# Patient Record
Sex: Male | Born: 1995 | Hispanic: Yes | Marital: Married | Smoking: Never smoker
Health system: Southern US, Community
[De-identification: ages and names within clinical notes are randomized; demographics above are authoritative.]

## PROBLEM LIST (undated history)

## (undated) HISTORY — PX: CHOLECYSTECTOMY: SHX55

---
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
## 2020-02-15 ENCOUNTER — Ambulatory Visit (HOSPITAL_COMMUNITY)
Admission: EM | Admit: 2020-02-15 | Discharge: 2020-02-15 | Disposition: A | Discharge status: Home / Self Care | Payer: Self-pay | Attending: Family Medicine | Admitting: Family Medicine

## 2020-02-15 ENCOUNTER — Other Ambulatory Visit: Payer: Self-pay

## 2020-02-15 ENCOUNTER — Encounter (HOSPITAL_COMMUNITY): Payer: Self-pay

## 2020-02-15 DIAGNOSIS — R1030 Lower abdominal pain, unspecified: Secondary | ICD-10-CM

## 2020-02-15 MED ORDER — POLYETHYLENE GLYCOL 3350 17 G PO PACK: 17.0000 g | PACK | Freq: Every day | ORAL | 0 refills | Status: AC

## 2020-02-15 NOTE — ED Provider Notes (Signed)
[facility]    CSN: [id number] Arrival date & time: 02/15/20  0806      History   Chief Complaint Chief Complaint  Patient presents with  . Abdominal Pain    HPI Bernard Foster is a 24 y.o. male.   HPI  Patient is here for mild crampy abdominal pain.  He is concerned because in a bowel movement this morning that had some blood streaks in the toilet.  He has never had this before.  He does have occasional constipation.  Irregular.  He states that he does have a history of eating fast food for the last couple of weeks, more than his usual.  No fever or chills.  No nausea or vomiting.  Else at home sick.  No recent travel  History reviewed. No pertinent past medical history.  There are no problems to display for this patient.   History reviewed. No pertinent surgical history.     Home Medications    Prior to Admission medications   Medication Sig Start Date End Date Taking? Authorizing Provider  polyethylene glycol (MIRALAX) 17 g packet Take 17 g by mouth daily. 02/15/20   Eustace Moore, MD    Family History Family History  Problem Relation Age of Onset  . Heart attack Maternal Grandfather   . Cancer Paternal Grandmother   . Diabetes Paternal Grandfather     Social History Social History   Tobacco Use  . Smoking status: Never Smoker  . Smokeless tobacco: Never Used  Substance Use Topics  . Alcohol use: No  . Drug use: No     Allergies   Patient has no known allergies.   Review of Systems Review of Systems  Gastrointestinal: Positive for abdominal pain, blood in stool and constipation.     Physical Exam Triage Vital Signs ED Triage Vitals [02/15/20 0839]  Enc Vitals Group     BP 124/73     Pulse Rate (!) 56     Resp 16     Temp 98.4 F (36.9 C)     Temp Source Oral     SpO2 98 %     Weight      Height      Head Circumference      Peak Flow      Pain Score 3     Pain Loc      Pain Edu?      Excl. in GC?    No data  found.  Updated Vital Signs BP 124/73 (BP Location: Right Arm)   Pulse (!) 56   Temp 98.4 F (36.9 C) (Oral)   Resp 16   SpO2 98%    Physical Exam Constitutional:      General: He is not in acute distress.    Appearance: He is well-developed.  HENT:     Head: Normocephalic and atraumatic.     Mouth/Throat:     Comments: Mask is in place Eyes:     Conjunctiva/sclera: Conjunctivae normal.     Pupils: Pupils are equal, round, and reactive to light.  Cardiovascular:     Rate and Rhythm: Normal rate.  Pulmonary:     Effort: Pulmonary effort is normal. No respiratory distress.  Abdominal:     General: Abdomen is flat. Bowel sounds are normal. There is no distension.     Palpations: Abdomen is soft. There is no hepatomegaly or splenomegaly.     Tenderness: There is no abdominal tenderness. Negative signs include Murphy's sign and  McBurney's sign.     Hernia: No hernia is present.     Comments: Well-healed right lower quadrant scar  Musculoskeletal:        General: Normal range of motion.     Cervical back: Normal range of motion.  Skin:    General: Skin is warm and dry.  Neurological:     Mental Status: He is alert.  Psychiatric:        Mood and Affect: Mood normal.        Behavior: Behavior normal.      UC Treatments / Results  Labs (all labs ordered are listed, but only abnormal results are displayed) Labs Reviewed - No data to display  EKG   Radiology No results found.  Procedures Procedures (including critical care time)  Medications Ordered in UC Medications - No data to display  Initial Impression / Assessment and Plan / UC Course  I have reviewed the triage vital signs and the nursing notes.  Pertinent labs & imaging results that were available during my care of the patient were reviewed by me and considered in my medical decision making (see chart for details).     Benign exam.  History of irregularity.  Trace of blood in stool.  Counseled Final  Clinical Impressions(s) / UC Diagnoses   Final diagnoses:  Lower abdominal pain     Discharge Instructions     Take the miralax daily Drink more water Avoid fatty or highly spiced foods for a couple of days Return for problems   ED Prescriptions    Medication Sig Dispense Auth. Provider   polyethylene glycol (MIRALAX) 17 g packet Take 17 g by mouth daily. 14 each Raylene Everts, MD     PDMP not reviewed this encounter.   Raylene Everts, MD 02/15/20 [phone]

## 2020-02-15 NOTE — ED Triage Notes (Signed)
Pt reports he woke up this morning with abdominal pain and he saw some blood in his stools thus morning.

## 2020-02-15 NOTE — Discharge Instructions (Signed)
Take the miralax daily Drink more water Avoid fatty or highly spiced foods for a couple of days Return for problems

## 2021-06-03 ENCOUNTER — Emergency Department (HOSPITAL_COMMUNITY)
Admission: EM | Admit: 2021-06-03 | Discharge: 2021-06-03 | Disposition: A | Discharge status: Home / Self Care | Payer: Self-pay | Attending: Emergency Medicine | Admitting: Emergency Medicine

## 2021-06-03 ENCOUNTER — Emergency Department (HOSPITAL_COMMUNITY): Payer: Self-pay

## 2021-06-03 ENCOUNTER — Encounter (HOSPITAL_COMMUNITY): Payer: Self-pay | Admitting: Emergency Medicine

## 2021-06-03 ENCOUNTER — Other Ambulatory Visit: Payer: Self-pay

## 2021-06-03 DIAGNOSIS — Z20822 Contact with and (suspected) exposure to covid-19: Secondary | ICD-10-CM | POA: Insufficient documentation

## 2021-06-03 DIAGNOSIS — R1013 Epigastric pain: Secondary | ICD-10-CM | POA: Insufficient documentation

## 2021-06-03 DIAGNOSIS — R112 Nausea with vomiting, unspecified: Secondary | ICD-10-CM | POA: Insufficient documentation

## 2021-06-03 LAB — CBC
HCT: 41.8 % (ref 39.0–52.0)
Hemoglobin: 14 g/dL (ref 13.0–17.0)
MCH: 28.6 pg (ref 26.0–34.0)
MCHC: 33.5 g/dL (ref 30.0–36.0)
MCV: 85.5 fL (ref 80.0–100.0)
Platelets: 334 10*3/uL (ref 150–400)
RBC: 4.89 MIL/uL (ref 4.22–5.81)
RDW: 13.1 % (ref 11.5–15.5)
WBC: 16.4 10*3/uL — ABNORMAL HIGH (ref 4.0–10.5)
nRBC: 0 % (ref 0.0–0.2)

## 2021-06-03 LAB — URINALYSIS, ROUTINE W REFLEX MICROSCOPIC
Bilirubin Urine: NEGATIVE
Glucose, UA: NEGATIVE mg/dL
Hgb urine dipstick: NEGATIVE
Ketones, ur: >80 mg/dL — AB
Leukocytes,Ua: NEGATIVE
Nitrite: NEGATIVE
Protein, ur: NEGATIVE mg/dL
Specific Gravity, Urine: 1.015 (ref 1.005–1.030)
pH: 8.5 — ABNORMAL HIGH (ref 5.0–8.0)

## 2021-06-03 LAB — COMPREHENSIVE METABOLIC PANEL
ALT: 15 U/L (ref 0–44)
AST: 22 U/L (ref 15–41)
Albumin: 4.5 g/dL (ref 3.5–5.0)
Alkaline Phosphatase: 94 U/L (ref 38–126)
Anion gap: 14 (ref 5–15)
BUN: 6 mg/dL (ref 6–20)
CO2: 20 mmol/L — ABNORMAL LOW (ref 22–32)
Calcium: 9.7 mg/dL (ref 8.9–10.3)
Chloride: 106 mmol/L (ref 98–111)
Creatinine, Ser: 0.92 mg/dL (ref 0.61–1.24)
GFR, Estimated: >60 mL/min (ref >60)
Glucose, Bld: 131 mg/dL — ABNORMAL HIGH (ref 70–99)
Potassium: 3.2 mmol/L — ABNORMAL LOW (ref 3.5–5.1)
Sodium: 140 mmol/L (ref 135–145)
Total Bilirubin: 1.2 mg/dL (ref 0.3–1.2)
Total Protein: 7.8 g/dL (ref 6.5–8.1)

## 2021-06-03 LAB — LIPASE, BLOOD: Lipase: 22 U/L (ref 11–51)

## 2021-06-03 LAB — TYPE AND SCREEN
ABO/RH(D): O NEG
Antibody Screen: NEGATIVE

## 2021-06-03 LAB — RESP PANEL BY RT-PCR (FLU A&B, COVID) ARPGX2
Influenza A by PCR: NEGATIVE
Influenza B by PCR: NEGATIVE
SARS Coronavirus 2 by RT PCR: NEGATIVE

## 2021-06-03 LAB — ABO/RH: ABO/RH(D): O NEG

## 2021-06-03 LAB — PROTIME-INR
INR: 1.1 (ref 0.8–1.2)
Prothrombin Time: 14 seconds (ref 11.4–15.2)

## 2021-06-03 MED ORDER — ONDANSETRON 4 MG PO TBDP: 4.0000 mg | ORAL_TABLET | Freq: Three times a day (TID) | ORAL | 0 refills | Status: AC | PRN

## 2021-06-03 MED ORDER — MORPHINE SULFATE (PF) 4 MG/ML IV SOLN
4.0000 mg | Freq: Once | INTRAVENOUS | Status: AC
  Administered 2021-06-03: 4 mg via INTRAVENOUS
  Filled 2021-06-03: qty 1

## 2021-06-03 MED ORDER — PANTOPRAZOLE SODIUM 40 MG PO TBEC: 40.0000 mg | DELAYED_RELEASE_TABLET | Freq: Every day | ORAL | 0 refills | Status: AC

## 2021-06-03 MED ORDER — IOHEXOL 350 MG/ML SOLN
100.0000 mL | Freq: Once | INTRAVENOUS | Status: AC | PRN
  Administered 2021-06-03: 100 mL via INTRAVENOUS

## 2021-06-03 MED ORDER — DICYCLOMINE HCL 10 MG PO CAPS
10.0000 mg | ORAL_CAPSULE | Freq: Once | ORAL | Status: AC
  Administered 2021-06-03: 10 mg via ORAL
  Filled 2021-06-03: qty 1

## 2021-06-03 MED ORDER — LACTATED RINGERS IV BOLUS
1000.0000 mL | Freq: Once | INTRAVENOUS | Status: AC
  Administered 2021-06-03: 1000 mL via INTRAVENOUS

## 2021-06-03 MED ORDER — ONDANSETRON HCL 4 MG/2ML IJ SOLN
4.0000 mg | Freq: Once | INTRAMUSCULAR | Status: AC
  Administered 2021-06-03: 4 mg via INTRAVENOUS
  Filled 2021-06-03: qty 2

## 2021-06-03 MED ORDER — ONDANSETRON 4 MG PO TBDP
4.0000 mg | ORAL_TABLET | Freq: Once | ORAL | Status: AC
  Administered 2021-06-03: 4 mg via ORAL
  Filled 2021-06-03: qty 1

## 2021-06-03 MED ORDER — DICYCLOMINE HCL 20 MG PO TABS: 20.0000 mg | ORAL_TABLET | Freq: Three times a day (TID) | ORAL | 0 refills | Status: AC | PRN

## 2021-06-03 NOTE — ED Notes (Signed)
Per EMT, pt vomited blood in bathroom.

## 2021-06-03 NOTE — ED Provider Notes (Signed)
Emergency Department Provider Note   I have reviewed the triage vital signs and the nursing notes.   HISTORY  Chief Complaint No chief complaint on file.  Offered Spanish interpreter but patient reports he prefers [place].   HPI Bernard Foster is a 25 y.o. male with past medical history reviewed below including prior appendectomy 1 year prior presents to the emergency department with epigastric abdominal pain along with nausea and vomiting.  Symptoms began yesterday but have worsened in the past 24 hours.  He is having more severe pain and arrived to the emergency department for evaluation.  While in the emergency department waiting room, the patient developed additional nausea and vomiting and had some reportedly bright red blood with vomiting.  He is not having chest pain or trouble breathing.  No fevers or chills.  He is not passing blood per rectum according to the patient.  No radiation of symptoms or other modifying factors. He drinks EtOH occasionally but not for the last week. Denies illicit drug use, specifically marijuana.    History reviewed. No pertinent past medical history.  There are no problems to display for this patient.   Past Surgical History:  Procedure Laterality Date   CHOLECYSTECTOMY      Allergies Patient has no known allergies.  Family History  Problem Relation Age of Onset   Heart attack Maternal Grandfather    Cancer Paternal Grandmother    Diabetes Paternal Grandfather     Social History Social History   Tobacco Use   Smoking status: Never   Smokeless tobacco: Never  Substance Use Topics   Alcohol use: No   Drug use: No    Review of Systems  Constitutional: No fever/chills Eyes: No visual changes. ENT: No sore throat. Cardiovascular: Denies chest pain. Respiratory: Denies shortness of breath. Gastrointestinal: Positive epigastric abdominal pain. Positive nausea and vomiting.  No diarrhea.  No constipation. Genitourinary: Negative  for dysuria. Musculoskeletal: Negative for back pain. Skin: Negative for rash. Neurological: Negative for headaches, focal weakness or numbness.  10-point ROS otherwise negative.  ____________________________________________   PHYSICAL EXAM:  VITAL SIGNS: ED Triage Vitals  Enc Vitals Group     BP 06/03/21 1217 (!) 184/101     Pulse Rate 06/03/21 1217 63     Resp 06/03/21 1217 16     Temp 06/03/21 1217 98.2 F (36.8 C)     Temp Source 06/03/21 1217 Oral     SpO2 06/03/21 1217 99 %   Constitutional: Alert and oriented. Well appearing and in no acute distress. Eyes: Conjunctivae are normal. Head: Atraumatic. Nose: No congestion/rhinnorhea. Mouth/Throat: Mucous membranes are moist.  Neck: No stridor.   Cardiovascular: Normal rate, regular rhythm. Good peripheral circulation. Grossly normal heart sounds.   Respiratory: Normal respiratory effort.  No retractions. Lungs CTAB. Gastrointestinal: Soft with mild epigastric and LUQ tenderness. No rebound or guarding. No distention.  Musculoskeletal: No gross deformities of extremities. Neurologic:  Normal speech and language.  Skin:  Skin is warm, dry and intact. No rash noted.   ____________________________________________   LABS (all labs ordered are listed, but only abnormal results are displayed)  Labs Reviewed  COMPREHENSIVE METABOLIC PANEL - Abnormal; Notable for the following components:      Result Value   Potassium 3.2 (*)    CO2 20 (*)    Glucose, Bld 131 (*)    All other components within normal limits  CBC - Abnormal; Notable for the following components:   WBC 16.4 (*)  All other components within normal limits  URINALYSIS, ROUTINE W REFLEX MICROSCOPIC - Abnormal; Notable for the following components:   pH 8.5 (*)    Ketones, ur >80 (*)    All other components within normal limits  RESP PANEL BY RT-PCR (FLU A&B, COVID) ARPGX2  LIPASE, BLOOD  PROTIME-INR  TYPE AND SCREEN  ABO/RH    ____________________________________________  EKG   EKG Interpretation  Date/Time:  Sunday June 03 2021 14:34:15 EDT Ventricular Rate:  57 PR Interval:  166 QRS Duration: 109 QT Interval:  423 QTC Calculation: 412 R Axis:   55 Text Interpretation: Sinus rhythm Confirmed by Alona Bene [phone]) on 06/03/2021 2:39:40 PM        ____________________________________________  RADIOLOGY  DG Chest 2 View  Result Date: 06/03/2021 CLINICAL DATA:  Shortness of breath, nausea and vomiting. EXAM: CHEST - 2 VIEW COMPARISON:  None. FINDINGS: The heart size and mediastinal contours are within normal limits. Both lungs are clear. The visualized skeletal structures are unremarkable. IMPRESSION: No active cardiopulmonary disease. Electronically Signed   By: Sherian Rein M.D.   On: 06/03/2021 13:31   CT ABDOMEN PELVIS W CONTRAST  Result Date: 06/03/2021 CLINICAL DATA:  Right lower quadrant pain since this morning, nausea and vomiting EXAM: CT ABDOMEN AND PELVIS WITH CONTRAST TECHNIQUE: Multidetector CT imaging of the abdomen and pelvis was performed using the standard protocol following bolus administration of intravenous contrast. CONTRAST:  OMNIPAQUE IOHEXOL 350 MG/ML SOLN COMPARISON:  None. FINDINGS: Lower chest: No acute pleural or parenchymal lung disease. Hepatobiliary: No focal liver abnormality is seen. No gallstones, gallbladder wall thickening, or biliary dilatation. Pancreas: Unremarkable. No pancreatic ductal dilatation or surrounding inflammatory changes. Spleen: Normal in size without focal abnormality. Adrenals/Urinary Tract: No urinary tract calculi or obstructive uropathy. The kidneys enhance normally. The adrenals and bladder are unremarkable. Stomach/Bowel: No bowel obstruction or ileus. The appendix, if still present, is not identified. Vertical scar within the right lower quadrant abdominal wall suggest possible prior appendectomy. No bowel wall thickening.  Vascular/Lymphatic: No significant vascular findings are present. No enlarged abdominal or pelvic lymph nodes. Reproductive: Prostate is unremarkable. Other: No free fluid or free gas.  No abdominal wall hernia. Musculoskeletal: No acute or destructive bony lesions. Reconstructed images demonstrate no additional findings. IMPRESSION: 1. No acute intra-abdominal or intrapelvic process. 2. Findings suggesting likely prior appendectomy. Please correlate with surgical history. Electronically Signed   By: Sharlet Salina M.D.   On: 06/03/2021 15:35    ____________________________________________   PROCEDURES  Procedure(s) performed:   Procedures  None  ____________________________________________   INITIAL IMPRESSION / ASSESSMENT AND PLAN / ED COURSE  Pertinent labs & imaging results that were available during my care of the patient were reviewed by me and considered in my medical decision making (see chart for details).   Patient presents emergency department with epigastric abdominal pain and vomiting.  Some streaky, bright red blood with vomiting in the waiting room per triage nursing report.  No peritoneal findings on exam.   Differential diagnosis includes but is not exclusive to acute cholecystitis, intrathoracic causes for epigastric abdominal pain, gastritis, duodenitis, pancreatitis, small bowel or large bowel obstruction, abdominal aortic aneurysm, hernia, gastritis, etc.  04:30 PM  CT imaging showing prior appendectomy. Gallbladder is radiographically normal.  Patient continues to have some abdominal soreness and nausea on reassessment.  His lab work here is reassuring other than leukocytosis he has normal LFTs, bilirubin, lipase.  No signs or specific symptoms to suggest infection or developing  sepsis although this was considered. Vitals not consistent with SIRS. Plan for Bentyl here along with meds at home for supportive care. Will give contact information for local GI and PCP for  follow up. Discussed strict ED return precautions.   ____________________________________________  FINAL CLINICAL IMPRESSION(S) / ED DIAGNOSES  Final diagnoses:  Epigastric pain  Non-intractable vomiting with nausea, unspecified vomiting type     MEDICATIONS GIVEN DURING THIS VISIT:  Medications  ondansetron (ZOFRAN-ODT) disintegrating tablet 4 mg (4 mg Oral Given 06/03/21 1239)  morphine 4 MG/ML injection 4 mg (4 mg Intravenous Given 06/03/21 1510)  ondansetron (ZOFRAN) injection 4 mg (4 mg Intravenous Given 06/03/21 1508)  lactated ringers bolus 1,000 mL (0 mLs Intravenous Stopped 06/03/21 1643)  iohexol (OMNIPAQUE) 350 MG/ML injection 100 mL (100 mLs Intravenous Contrast Given 06/03/21 1508)  dicyclomine (BENTYL) capsule 10 mg (10 mg Oral Given 06/03/21 1643)     NEW OUTPATIENT MEDICATIONS STARTED DURING THIS VISIT:  New Prescriptions   DICYCLOMINE (BENTYL) 20 MG TABLET    Take 1 tablet (20 mg total) by mouth 3 (three) times daily as needed for spasms (abdominal cramping).   ONDANSETRON (ZOFRAN ODT) 4 MG DISINTEGRATING TABLET    Take 1 tablet (4 mg total) by mouth every 8 (eight) hours as needed for nausea or vomiting.   PANTOPRAZOLE (PROTONIX) 40 MG TABLET    Take 1 tablet (40 mg total) by mouth daily.    Note:  This document was prepared using Dragon voice recognition software and may include unintentional dictation errors.  Alona Bene, MD, [facility]    Anely Spiewak, Arlyss Repress, MD 06/03/21 [phone]

## 2021-06-03 NOTE — ED Notes (Signed)
Patient transported to CT 

## 2021-06-03 NOTE — ED Triage Notes (Addendum)
Pt reports severe RLQ pain since this morning with nausea and vomiting.  Pt hyperventilating and sticking his finger down his throat to induce vomiting.  Zofran given at triage.  Denies diarrhea.  Gallbladder removed 1 year ago.

## 2021-06-03 NOTE — ED Provider Notes (Signed)
Emergency Medicine Provider Triage Evaluation Note  Bernard Foster , a 25 y.o. male  was evaluated in triage.  Pt complains of periumbilical/right lower quadrant abdominal pain that started suddenly this morning with severe nausea 5 episodes of NBNB emesis.  No history of diarrhea patient is diaphoretic, endorses fevers and chills.  History of cholecystectomy 1 year ago.  Review of Systems  Positive: Periumbilical and right lower abdominal pain, nausea, vomiting, chills, diaphoresis, shortness of breath Negative: Chest pain  Physical Exam  BP (!) 184/101 (BP Location: Left Arm)   Pulse 63   Temp 98.2 F (36.8 C) (Oral)   Resp 16   SpO2 99%  Gen:   Awake, no distress   Resp:  Normal effort  MSK:   Moves extremities without difficulty  Other:  Rales in lungs bilaterally, periumbilical and right lower abdominal tenderness palpation without rebound or guarding.  Patient vomiting throughout exam.  Medical Decision Making  Medically screening exam initiated at 12:45 PM.  Appropriate orders placed.  Bernard Foster was informed that the remainder of the evaluation will be completed by another provider, this initial triage assessment does not replace that evaluation, and the importance of remaining in the ED until their evaluation is complete.  Zofran administered in triage.  This chart was dictated using voice recognition software, Dragon. Despite the best efforts of this provider to proofread and correct errors, errors may still occur which can change documentation meaning.    Sherrilee Gilles 06/03/21 1246    Maia Plan, MD 06/10/21 1727

## 2021-06-03 NOTE — ED Notes (Signed)
Pt ambulated to the bathroom on his own power, gait even and steady 

## 2021-06-03 NOTE — Discharge Instructions (Signed)

## 2021-10-30 ENCOUNTER — Emergency Department (HOSPITAL_COMMUNITY): Payer: Self-pay

## 2021-10-30 ENCOUNTER — Encounter (HOSPITAL_COMMUNITY): Payer: Self-pay | Admitting: Emergency Medicine

## 2021-10-30 ENCOUNTER — Emergency Department (HOSPITAL_COMMUNITY)
Admission: EM | Admit: 2021-10-30 | Discharge: 2021-10-30 | Disposition: A | Discharge status: Home / Self Care | Payer: Self-pay | Attending: Emergency Medicine | Admitting: Emergency Medicine

## 2021-10-30 DIAGNOSIS — R55 Syncope and collapse: Secondary | ICD-10-CM | POA: Insufficient documentation

## 2021-10-30 DIAGNOSIS — R519 Headache, unspecified: Secondary | ICD-10-CM | POA: Insufficient documentation

## 2021-10-30 LAB — CBC WITH DIFFERENTIAL/PLATELET
Abs Immature Granulocytes: 0.02 10*3/uL (ref 0.00–0.07)
Basophils Absolute: 0.1 10*3/uL (ref 0.0–0.1)
Basophils Relative: 1 %
Eosinophils Absolute: 0.1 10*3/uL (ref 0.0–0.5)
Eosinophils Relative: 1 %
HCT: 40.9 % (ref 39.0–52.0)
Hemoglobin: 13.6 g/dL (ref 13.0–17.0)
Immature Granulocytes: 0 %
Lymphocytes Relative: 37 %
Lymphs Abs: 3.9 10*3/uL (ref 0.7–4.0)
MCH: 27.9 pg (ref 26.0–34.0)
MCHC: 33.3 g/dL (ref 30.0–36.0)
MCV: 84 fL (ref 80.0–100.0)
Monocytes Absolute: 0.6 10*3/uL (ref 0.1–1.0)
Monocytes Relative: 6 %
Neutro Abs: 5.9 10*3/uL (ref 1.7–7.7)
Neutrophils Relative %: 55 %
Platelets: 212 10*3/uL (ref 150–400)
RBC: 4.87 MIL/uL (ref 4.22–5.81)
RDW: 13.2 % (ref 11.5–15.5)
WBC: 10.5 10*3/uL (ref 4.0–10.5)
nRBC: 0 % (ref 0.0–0.2)

## 2021-10-30 LAB — BASIC METABOLIC PANEL
Anion gap: 7 (ref 5–15)
BUN: 7 mg/dL (ref 6–20)
CO2: 28 mmol/L (ref 22–32)
Calcium: 8.3 mg/dL — ABNORMAL LOW (ref 8.9–10.3)
Chloride: 101 mmol/L (ref 98–111)
Creatinine, Ser: 0.74 mg/dL (ref 0.61–1.24)
GFR, Estimated: >60 mL/min (ref >60)
Glucose, Bld: 101 mg/dL — ABNORMAL HIGH (ref 70–99)
Potassium: 3.1 mmol/L — ABNORMAL LOW (ref 3.5–5.1)
Sodium: 136 mmol/L (ref 135–145)

## 2021-10-30 MED ORDER — POTASSIUM CHLORIDE CRYS ER 20 MEQ PO TBCR
40.0000 meq | EXTENDED_RELEASE_TABLET | Freq: Once | ORAL | Status: AC
  Administered 2021-10-30: 40 meq via ORAL
  Filled 2021-10-30: qty 2

## 2021-10-30 MED ORDER — ACETAMINOPHEN 325 MG PO TABS
650.0000 mg | ORAL_TABLET | Freq: Once | ORAL | Status: AC
  Administered 2021-10-30: 650 mg via ORAL
  Filled 2021-10-30: qty 2

## 2021-10-30 MED ORDER — SODIUM CHLORIDE 0.9 % IV BOLUS
1000.0000 mL | Freq: Once | INTRAVENOUS | Status: AC
Start: 2021-10-30 — End: 2021-10-30
  Administered 2021-10-30: 1000 mL via INTRAVENOUS

## 2021-10-30 NOTE — ED Provider Notes (Signed)
[facility] [place] [facility]-EMERGENCY DEPT Provider Note   CSN: [id number] Arrival date & time: 10/30/21  1404     History  Chief Complaint  Patient presents with   Headache    Bernard Foster is a 26 y.o. male with no significant past medical history who presents to the ED after a syncopal episode.  Patient states he "passed out" and fell back and hit the back of his head.  He is not currently on any blood thinners.  No preceding symptoms.  Patient states he has not been eating and drinking normally for the past 2 weeks due to stress related to his girlfriend.  Patient notes prior to the episode he smoked marijuana and drank from 1 Corona; however, did not finish the entire beer.  Denies daily alcohol use.  No history of seizures.  Denies urinary incontinence and tongue trauma.  Patient endorses a headache.  No nausea or vomiting. Patient was not post-ictal upon EMS arrival.        Home Medications Prior to Admission medications   Medication Sig Start Date End Date Taking? Authorizing Provider  dicyclomine (BENTYL) 20 MG tablet Take 1 tablet (20 mg total) by mouth 3 (three) times daily as needed for spasms (abdominal cramping). 06/03/21   Long, Arlyss Repress, MD  ondansetron (ZOFRAN ODT) 4 MG disintegrating tablet Take 1 tablet (4 mg total) by mouth every 8 (eight) hours as needed for nausea or vomiting. 06/03/21   Long, Arlyss Repress, MD  pantoprazole (PROTONIX) 40 MG tablet Take 1 tablet (40 mg total) by mouth daily. 06/03/21   Long, Arlyss Repress, MD  polyethylene glycol (MIRALAX) 17 g packet Take 17 g by mouth daily. 02/15/20   Eustace Moore, MD      Allergies    Patient has no known allergies.    Review of Systems   Review of Systems  Cardiovascular:  Negative for chest pain.  Gastrointestinal:  Negative for nausea and vomiting.  Neurological:  Positive for headaches. Negative for dizziness and weakness.   Physical Exam Updated Vital Signs BP 109/65    Pulse 80    Temp  98.2 F (36.8 C) (Oral)    Resp 19    SpO2 100%  Physical Exam Vitals and nursing note reviewed.  Constitutional:      General: He is not in acute distress.    Appearance: He is not ill-appearing.  HENT:     Head: Normocephalic.  Eyes:     Pupils: Pupils are equal, round, and reactive to light.  Cardiovascular:     Rate and Rhythm: Normal rate and regular rhythm.     Pulses: Normal pulses.     Heart sounds: Normal heart sounds. No murmur heard.   No friction rub. No gallop.  Pulmonary:     Effort: Pulmonary effort is normal.     Breath sounds: Normal breath sounds.  Abdominal:     General: Abdomen is flat. There is no distension.     Palpations: Abdomen is soft.     Tenderness: There is no abdominal tenderness. There is no guarding or rebound.  Musculoskeletal:        General: Normal range of motion.     Cervical back: Neck supple.  Skin:    General: Skin is warm and dry.  Neurological:     General: No focal deficit present.     Mental Status: He is alert.     Comments: Speech is clear, able to follow commands CN  III-XII intact Normal strength in upper and lower extremities bilaterally including dorsiflexion and plantar flexion, strong and equal grip strength Sensation grossly intact throughout Moves extremities without ataxia, coordination intact No pronator drift Ambulates without difficulty  Psychiatric:        Mood and Affect: Mood normal.        Behavior: Behavior normal.    ED Results / Procedures / Treatments   Labs (all labs ordered are listed, but only abnormal results are displayed) Labs Reviewed  BASIC METABOLIC PANEL - Abnormal; Notable for the following components:      Result Value   Potassium 3.1 (*)    Glucose, Bld 101 (*)    Calcium 8.3 (*)    All other components within normal limits  CBC WITH DIFFERENTIAL/PLATELET    EKG None  Radiology CT Head Wo Contrast  Result Date: 10/30/2021 CLINICAL DATA:  Head and neck trauma.  Altered mental  status. EXAM: CT HEAD WITHOUT CONTRAST CT CERVICAL SPINE WITHOUT CONTRAST TECHNIQUE: Multidetector CT imaging of the head and cervical spine was performed following the standard protocol without intravenous contrast. Multiplanar CT image reconstructions of the cervical spine were also generated. RADIATION DOSE REDUCTION: This exam was performed according to the departmental dose-optimization program which includes automated exposure control, adjustment of the mA and/or kV according to patient size and/or use of iterative reconstruction technique. COMPARISON:  None. FINDINGS: CT HEAD FINDINGS Brain: There is no evidence of an acute infarct, intracranial hemorrhage, mass, midline shift, or extra-axial fluid collection. The ventricles and sulci are normal. Vascular: No hyperdense vessel. Skull: No fracture or suspicious osseous lesion. Sinuses/Orbits: Mild left frontal and left ethmoid sinus mucosal thickening. Clear mastoid air cells. Unremarkable included orbits. Other: None. CT CERVICAL SPINE FINDINGS Alignment: Cervical spine straightening.  No listhesis. Skull base and vertebrae: No acute fracture or suspicious osseous lesion. Soft tissues and spinal canal: No prevertebral fluid or swelling. No visible canal hematoma. Disc levels:  Unremarkable. Upper chest: Clear lung apices. Other: Borderline enlarged level II lymph nodes, nonspecific but most likely reactive bilateral tonsillar hypertrophy. IMPRESSION: 1. No evidence of acute intracranial abnormality. 2. No evidence of acute fracture or subluxation in the cervical spine. Electronically Signed   By: Sebastian Ache M.D.   On: 10/30/2021 15:55   CT Cervical Spine Wo Contrast  Result Date: 10/30/2021 CLINICAL DATA:  Head and neck trauma.  Altered mental status. EXAM: CT HEAD WITHOUT CONTRAST CT CERVICAL SPINE WITHOUT CONTRAST TECHNIQUE: Multidetector CT imaging of the head and cervical spine was performed following the standard protocol without intravenous  contrast. Multiplanar CT image reconstructions of the cervical spine were also generated. RADIATION DOSE REDUCTION: This exam was performed according to the departmental dose-optimization program which includes automated exposure control, adjustment of the mA and/or kV according to patient size and/or use of iterative reconstruction technique. COMPARISON:  None. FINDINGS: CT HEAD FINDINGS Brain: There is no evidence of an acute infarct, intracranial hemorrhage, mass, midline shift, or extra-axial fluid collection. The ventricles and sulci are normal. Vascular: No hyperdense vessel. Skull: No fracture or suspicious osseous lesion. Sinuses/Orbits: Mild left frontal and left ethmoid sinus mucosal thickening. Clear mastoid air cells. Unremarkable included orbits. Other: None. CT CERVICAL SPINE FINDINGS Alignment: Cervical spine straightening.  No listhesis. Skull base and vertebrae: No acute fracture or suspicious osseous lesion. Soft tissues and spinal canal: No prevertebral fluid or swelling. No visible canal hematoma. Disc levels:  Unremarkable. Upper chest: Clear lung apices. Other: Borderline enlarged level II lymph nodes,  nonspecific but most likely reactive bilateral tonsillar hypertrophy. IMPRESSION: 1. No evidence of acute intracranial abnormality. 2. No evidence of acute fracture or subluxation in the cervical spine. Electronically Signed   By: Sebastian Ache M.D.   On: 10/30/2021 15:55    Procedures Procedures    Medications Ordered in ED Medications  acetaminophen (TYLENOL) tablet 650 mg (650 mg Oral Given 10/30/21 1557)  sodium chloride 0.9 % bolus 1,000 mL (0 mLs Intravenous Stopped 10/30/21 1809)  potassium chloride SA (KLOR-CON M) CR tablet 40 mEq (40 mEq Oral Given 10/30/21 1713)  sodium chloride 0.9 % bolus 1,000 mL (1,000 mLs Intravenous New Bag/Given 10/30/21 1809)    ED Course/ Medical Decision Making/ A&P Clinical Course as of 10/30/21 1914  Tue Oct 30, 2021  1533 BP(!): 83/51 [CA]  1642  Potassium(!): 3.1 [CA]    Clinical Course User Index [CA] Mannie Stabile, PA-C                           Medical Decision Making Amount and/or Complexity of Data Reviewed Independent Historian: friend    Details: brother at bedside Labs: ordered. Decision-making details documented in ED Course. Radiology: ordered and independent interpretation performed. Decision-making details documented in ED Course. ECG/medicine tests: ordered and independent interpretation performed. Decision-making details documented in ED Course.  Risk OTC drugs. Prescription drug management.   26 year old male presents to the ED after syncopal episode.  Patient notes he has not been eating and drinking normally for the past 2 weeks due to stress related to his girlfriend.  Prior to his syncopal episode, patient smoked marijuana and drink from 1 beer.  Denies daily alcohol use.  No previous seizures.  Denies urinary incontinence and tongue trauma.  Upon arrival, vitals all within normal limits.  Patient no acute distress.  Benign physical exam.  Normal neurological exam without any focal deficits.  CT head and cervical spine due to trauma.  Routine labs to rule electrolyte abnormalities due to decreased p.o. intake and syncope.  EKG to rule out cardiac etiology of syncope however, suspicion is lower given no preceding chest pain or cardiac risk factors.  Tylenol given for headache.  Upon reassessment, BP rechecked and patient found to be hypotensive. IVFs given. Hypotension could account for patient's syncopal episode. No chest pain or shortness of breath. Low suspicion for PE/DVT.  CBC unremarkable.  No leukocytosis and normal hemoglobin.  BMP significant for hypokalemia at 3.1.  Potassium repleted here in the ED.  CT head and cervical spine personally reviewed and interpreted which is negative for any acute abnormalities.  Agree with radiology interpretation.  EKG demonstrates normal sinus rhythm with no signs of  acute ischemia.  Upon reassessment, patient admits to feeling better after IVFs. BP improved. Patient observed for 5 hours without any further syncopal episodes. Low suspicion for cardiac etiology of syncope.  Syncope could be related to hypotension. Hospitalization considered however, felt patient was stable for discharge given his BP improved and no further syncopal episodes after 5 hours in the ED. Strict ED precautions discussed with patient. Patient states understanding and agrees to plan. Patient discharged home in no acute distress and stable vitals        Final Clinical Impression(s) / ED Diagnoses Final diagnoses:  Syncope, unspecified syncope type    Rx / DC Orders ED Discharge Orders     None         Mannie Stabile, [place] 10/30/21 1915  Linwood DibblesKnapp, Jon, MD 10/30/21 2356

## 2021-10-30 NOTE — ED Triage Notes (Signed)
Per EMS-states he smoked some weed and then drank a Corona and friends called because he was going "in and out"-initially patient did not want to be transported but when EMS arrived, he stated he wanted to be checked out

## 2021-10-30 NOTE — Discharge Instructions (Signed)
It was a pleasure taking care of you today. As discussed, your BP was low in the ER. I suspect that caused you to pass out. All of your labs were reassuring. Continue to drink extra fluids. Follow-up with PCP within the next week. Return to the ER for new or worsening symptoms.

## 2022-09-14 ENCOUNTER — Emergency Department (HOSPITAL_COMMUNITY): Payer: Self-pay

## 2022-09-14 ENCOUNTER — Emergency Department (HOSPITAL_COMMUNITY)
Admission: EM | Admit: 2022-09-14 | Discharge: 2022-09-14 | Disposition: A | Discharge status: Home / Self Care | Payer: Self-pay | Attending: Emergency Medicine | Admitting: Emergency Medicine

## 2022-09-14 ENCOUNTER — Other Ambulatory Visit: Payer: Self-pay

## 2022-09-14 DIAGNOSIS — E876 Hypokalemia: Secondary | ICD-10-CM | POA: Insufficient documentation

## 2022-09-14 DIAGNOSIS — R55 Syncope and collapse: Secondary | ICD-10-CM

## 2022-09-14 LAB — BASIC METABOLIC PANEL
Anion gap: 7 (ref 5–15)
BUN: 9 mg/dL (ref 6–20)
CO2: 26 mmol/L (ref 22–32)
Calcium: 8.8 mg/dL — ABNORMAL LOW (ref 8.9–10.3)
Chloride: 105 mmol/L (ref 98–111)
Creatinine, Ser: 0.83 mg/dL (ref 0.61–1.24)
GFR, Estimated: >60 mL/min (ref >60)
Glucose, Bld: 112 mg/dL — ABNORMAL HIGH (ref 70–99)
Potassium: 3.4 mmol/L — ABNORMAL LOW (ref 3.5–5.1)
Sodium: 138 mmol/L (ref 135–145)

## 2022-09-14 LAB — CBC WITH DIFFERENTIAL/PLATELET
Abs Immature Granulocytes: 0.03 10*3/uL (ref 0.00–0.07)
Basophils Absolute: 0.1 10*3/uL (ref 0.0–0.1)
Basophils Relative: 1 %
Eosinophils Absolute: 0.2 10*3/uL (ref 0.0–0.5)
Eosinophils Relative: 2 %
HCT: 43.6 % (ref 39.0–52.0)
Hemoglobin: 14.2 g/dL (ref 13.0–17.0)
Immature Granulocytes: 0 %
Lymphocytes Relative: 25 %
Lymphs Abs: 2.5 10*3/uL (ref 0.7–4.0)
MCH: 28.6 pg (ref 26.0–34.0)
MCHC: 32.6 g/dL (ref 30.0–36.0)
MCV: 87.9 fL (ref 80.0–100.0)
Monocytes Absolute: 0.6 10*3/uL (ref 0.1–1.0)
Monocytes Relative: 6 %
Neutro Abs: 6.7 10*3/uL (ref 1.7–7.7)
Neutrophils Relative %: 66 %
Platelets: 332 10*3/uL (ref 150–400)
RBC: 4.96 MIL/uL (ref 4.22–5.81)
RDW: 13.1 % (ref 11.5–15.5)
WBC: 10.1 10*3/uL (ref 4.0–10.5)
nRBC: 0 % (ref 0.0–0.2)

## 2022-09-14 MED ORDER — SODIUM CHLORIDE 0.9 % IV BOLUS
1000.0000 mL | Freq: Once | INTRAVENOUS | Status: AC
  Administered 2022-09-14: 1000 mL via INTRAVENOUS

## 2022-09-14 MED ORDER — KETOROLAC TROMETHAMINE 15 MG/ML IJ SOLN
15.0000 mg | Freq: Once | INTRAMUSCULAR | Status: AC
  Administered 2022-09-14: 15 mg via INTRAVENOUS
  Filled 2022-09-14: qty 1

## 2022-09-14 MED ORDER — POTASSIUM CHLORIDE CRYS ER 20 MEQ PO TBCR
40.0000 meq | EXTENDED_RELEASE_TABLET | Freq: Once | ORAL | Status: DC
  Filled 2022-09-14: qty 2

## 2022-09-14 NOTE — ED Provider Notes (Signed)
[facility] [place] [facility]-EM[facility]ENCY DEPT Provider Note   CSN: [id number] Arrival date & time: 09/14/22  1135     History  Chief Complaint  Patient presents with   Loss of Consciousness    Bernard Foster is a 26 y.o. male.  With no significant past medical history who presents to the ED via EMS for evaluation of syncopal episode.  States he was in the convenience store when he passed out and hit the back of his head.  Had a short episode of dizziness, prior to syncopal episode. Denies lightheadedness or headache.  Similar episode happened few months ago per patient.  Chart review reveals that a fairly similar episode happened on 10/30/2021.  He was found to be slightly hypotensive at that time.  Workup was otherwise unremarkable.  He has not had any similar episodes since that time until today.  He denies other symptoms at this time. States he is feeling better on my evaluation. Back of his head hurts from the fall.   Loss of Consciousness      Home Medications Prior to Admission medications   Medication Sig Start Date End Date Taking? Authorizing Provider  dicyclomine (BENTYL) 20 MG tablet Take 1 tablet (20 mg total) by mouth 3 (three) times daily as needed for spasms (abdominal cramping). 06/03/21   Long, Arlyss Repress, MD  ondansetron (ZOFRAN ODT) 4 MG disintegrating tablet Take 1 tablet (4 mg total) by mouth every 8 (eight) hours as needed for nausea or vomiting. 06/03/21   Long, Arlyss Repress, MD  pantoprazole (PROTONIX) 40 MG tablet Take 1 tablet (40 mg total) by mouth daily. 06/03/21   Long, Arlyss Repress, MD  polyethylene glycol (MIRALAX) 17 g packet Take 17 g by mouth daily. 02/15/20   Eustace Moore, MD      Allergies    Patient has no known allergies.    Review of Systems   Review of Systems  Cardiovascular:  Positive for syncope.  Neurological:  Positive for syncope.  All other systems reviewed and are negative.   Physical Exam Updated Vital Signs BP 111/66    Pulse 84   Temp 98.2 F (36.8 C) (Oral)   Resp 18   Ht 5\' 6"  (1.676 m)   Wt 91 kg   SpO2 100%   BMI 32.38 kg/m  Physical Exam Vitals and nursing note reviewed.  Constitutional:      General: He is not in acute distress.    Appearance: Normal appearance. He is well-developed. He is not ill-appearing, toxic-appearing or diaphoretic.  HENT:     Head: Normocephalic and atraumatic.  Eyes:     Extraocular Movements: Extraocular movements intact.     Conjunctiva/sclera: Conjunctivae normal.     Pupils: Pupils are equal, round, and reactive to light.     Comments: No nystagmus  Cardiovascular:     Rate and Rhythm: Normal rate and regular rhythm.     Heart sounds: No murmur heard. Pulmonary:     Effort: Pulmonary effort is normal. No respiratory distress.     Breath sounds: Normal breath sounds.  Abdominal:     General: Abdomen is flat.     Palpations: Abdomen is soft.     Tenderness: There is no abdominal tenderness.  Musculoskeletal:        General: No swelling.     Cervical back: Neck supple.  Skin:    General: Skin is warm and dry.     Capillary Refill: Capillary refill takes less than  2 seconds.  Neurological:     General: No focal deficit present.     Mental Status: He is alert and oriented to person, place, and time.     Comments: No unilateral or global weakness, facial asymmetry, slurred speech, pronator drift. Normal finger to nose, heel to shin and shoulder shrug. Sensation intact in all extremities.  Psychiatric:        Mood and Affect: Mood normal.     ED Results / Procedures / Treatments   Labs (all labs ordered are listed, but only abnormal results are displayed) Labs Reviewed  BASIC METABOLIC PANEL - Abnormal; Notable for the following components:      Result Value   Potassium 3.4 (*)    Glucose, Bld 112 (*)    Calcium 8.8 (*)    All other components within normal limits  CBC WITH DIFFERENTIAL/PLATELET    EKG EKG  Interpretation  Date/Time:  Saturday September 14 2022 12:13:57 EST Ventricular Rate:  92 PR Interval:  121 QRS Duration: 99 QT Interval:  335 QTC Calculation: 415 R Axis:   40 Text Interpretation: Sinus rhythm ST elev, probable normal early repol pattern No significant change since prior 2/23 Confirmed by Meridee Score [phone]) on 09/14/2022 12:21:54 PM  Radiology CT Head Wo Contrast  Result Date: 09/14/2022 CLINICAL DATA:  Syncope/presyncope EXAM: CT HEAD WITHOUT CONTRAST TECHNIQUE: Contiguous axial images were obtained from the base of the skull through the vertex without intravenous contrast. RADIATION DOSE REDUCTION: This exam was performed according to the departmental dose-optimization program which includes automated exposure control, adjustment of the mA and/or kV according to patient size and/or use of iterative reconstruction technique. COMPARISON:  10/30/2021 FINDINGS: Brain: No acute intracranial findings are seen. There are no signs of bleeding within the cranium. Ventricles are not dilated. Vascular: Unremarkable. Skull: Unremarkable. Sinuses/Orbits: There is mucosal thickening in ethmoid and sphenoid sinuses. Other: None. IMPRESSION: No acute intracranial findings are seen in noncontrast CT brain. No significant interval changes are noted. Electronically Signed   By: Ernie Avena M.D.   On: 09/14/2022 12:48    Procedures Procedures    Medications Ordered in ED Medications  ketorolac (TORADOL) 15 MG/ML injection 15 mg (has no administration in time range)  potassium chloride SA (KLOR-CON M) CR tablet 40 mEq (has no administration in time range)  sodium chloride 0.9 % bolus 1,000 mL (1,000 mLs Intravenous New Bag/Given 09/14/22 1714)    ED Course/ Medical Decision Making/ A&P                           Medical Decision Making Amount and/or Complexity of Data Reviewed Labs: ordered. Radiology: ordered.  This patient presents to the ED for concern of syncopal  episode, this involves an extensive number of treatment options, and is a complaint that carries with it a high risk of complications and morbidity.  The differential for syncope is extensive and includes, but is not limited to: arrythmia (Vtach, SVT, SSS, sinus arrest, AV block, bradycardia) aortic stenosis, AMI, HOCM, PE, atrial myxoma, pulmonary hypertension, orthostatic hypotension, (hypovolemia, drug effect, GB syndrome, micturition, cough, swall) carotid sinus sensitivity, Seizure, TIA/CVA, hypoglycemia,  Vertigo.    Co morbidities that complicate the patient evaluation  previous episode  My initial workup includes basic labs, EKG, CT head  Additional history obtained from: Nursing notes from this visit. Previous records within EMR system visit on 10/30/2021 for similar  I ordered, reviewed and interpreted labs which include:  BMP, CBC. Slight hypokalemia, workup otherwise negative  I ordered imaging studies including CT head I independently visualized and interpreted imaging which showed no acute intracranial abnormalities I agree with the radiologist interpretation  Afebrile, hemodynamically stable.  26 year old male presenting to the ED for evaluation of syncopal episode.  This is his second syncopal episode this year.  He did have a prodrome of dizziness.  He did not have any tongue trauma or urinary incontinence and I have low suspicion for seizure.  Physical exam including neurologic exam is unremarkable.  CT head negative.  He had slight hypokalemia of 3.4.  This is treated in the ED.  He states that he has been eating and drinking, however has been eating a lot of junk food and has been drinking nothing but caffeinated beverages lately.  He did have a low blood pressure reading on arrival.  Was treated with IV fluids and reported he was feeling much better.  Hypotension versus hypovolemia on most likely cause of his syncopal episode.  EKG showed early repolarization, was otherwise  unremarkable.  He had no chest pain or shortness of breath and have low suspicion for cardiac etiology of his syncope. Ambulatory referral will be sent for cardiology as this is the second syncopal episode in 10 months without a clear cause.  Orthostatic vitals are stable.  He was given return precautions. Stable at discharge.  At this time there does not appear to be any evidence of an acute emergency medical condition and the patient appears stable for discharge with appropriate outpatient follow up. Diagnosis was discussed with patient who verbalizes understanding of care plan and is agreeable to discharge. I have discussed return precautions with patient who verbalizes understanding. Patient encouraged to follow-up with their PCP within 1 week. All questions answered.  Patient's case discussed with Dr. Adela Lank who agrees with plan to discharge with follow-up.   Note: Portions of this report may have been transcribed using voice recognition software. Every effort was made to ensure accuracy; however, inadvertent computerized transcription errors may still be present.          Final Clinical Impression(s) / ED Diagnoses Final diagnoses:  Syncope, unspecified syncope type    Rx / DC Orders ED Discharge Orders          Ordered    Ambulatory referral to Cardiology       Comments: If you have not heard from the Cardiology office within the next 72 hours please call [phone].   09/14/22 1814              Michelle Piper, PA-C 09/14/22 1830    Melene Plan, DO 09/14/22 2214

## 2022-09-14 NOTE — ED Notes (Signed)
Pt wanted to wait to get an IV for blood work.

## 2022-09-14 NOTE — ED Provider Triage Note (Signed)
Emergency Medicine Provider Triage Evaluation Note  Bernard Foster , a 26 y.o. male  was evaluated in triage.  Pt complains of syncopal episode.  Was standing in the community and started getting snacks when he passed out and hit the back of his head on the ground.  Denies prodrome.  Currently has mild posterior head pain.  Denies other symptoms at this time.  States this happened approximately 2 months ago as well.  Was not worked up at that time.  Has been eating and drinking like normal.  Denies recent illness  Review of Systems  Positive: As above Negative: As above  Physical Exam  BP (!) 102/43 (BP Location: Right Arm)   Pulse 75   Temp 98.5 F (36.9 C) (Oral)   Resp 18   Ht 5\' 6"  (1.676 m)   Wt 91 kg   SpO2 100%   BMI 32.38 kg/m  Gen:   Awake, no distress   Resp:  Normal effort  MSK:   Moves extremities without difficulty  Other:  Resting comfortably in chair  Medical Decision Making  Medically screening exam initiated at 12:09 PM.  Appropriate orders placed.  Price Lachapelle Newman Pies was informed that the remainder of the evaluation will be completed by another provider, this initial triage assessment does not replace that evaluation, and the importance of remaining in the ED until their evaluation is complete.     Gerhard Perches, Michelle Piper 09/14/22 1210

## 2022-09-14 NOTE — Discharge Instructions (Addendum)
You have been seen today for your complaint of syncopal episode. Your lab work showed low potassium, was otherwise normal. Your imaging was normal. Home care instructions are as follows:  Drink plenty of fluids Follow up with: cardiology. They will call you to schedule an appointment. Call the number listed if they don't call you by the end of the week next week. Please seek immediate medical care if you develop any of the following symptoms: You faint. You hit your head or are injured after fainting. You have any of these symptoms that may indicate trouble with your heart: Fast or irregular heartbeats (palpitations). Unusual pain in your chest, abdomen, or back. Shortness of breath. You have a seizure. You have a severe headache. You are confused. You have vision problems. You have severe weakness or trouble walking. You are bleeding from your mouth or rectum, or you have black or tarry stool. At this time there does not appear to be the presence of an emergent medical condition, however there is always the potential for conditions to change. Please read and follow the below instructions.  Do not take your medicine if  develop an itchy rash, swelling in your mouth or lips, or difficulty breathing; call 911 and seek immediate emergency medical attention if this occurs.  You may review your lab tests and imaging results in their entirety on your MyChart account.  Please discuss all results of fully with your primary care provider and other specialist at your follow-up visit.  Note: Portions of this text may have been transcribed using voice recognition software. Every effort was made to ensure accuracy; however, inadvertent computerized transcription errors may still be present.

## 2022-09-14 NOTE — ED Triage Notes (Addendum)
Pt BIB EMS after syncopal episode at a convenience store. Pt fell backwards onto tile floor.

## 2022-09-19 IMAGING — CT CT CERVICAL SPINE W/O CM
3 of 4 series · 12 of 33 positions shown, 14 images · non-contrast
Comparison: None.

CLINICAL DATA: Head and neck trauma.  Altered mental status.



[Series 5: orthogonal bone · axial · 0.33mm/px · z∈[-293,-139]mm · 4 of 119 slices shown, 5 images]
[im 17/119  soft-tissue]
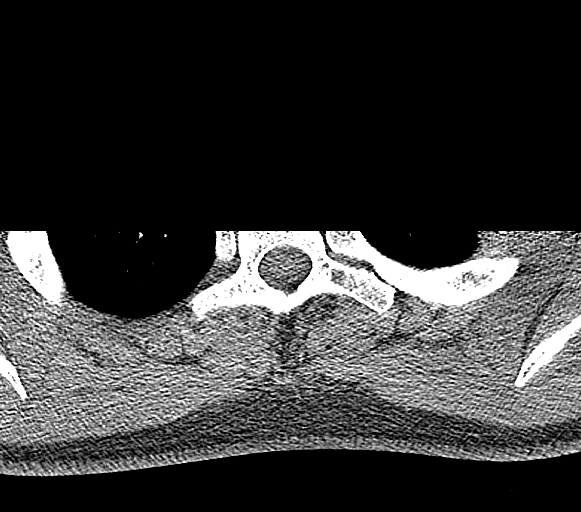
[im 17/119  bone]
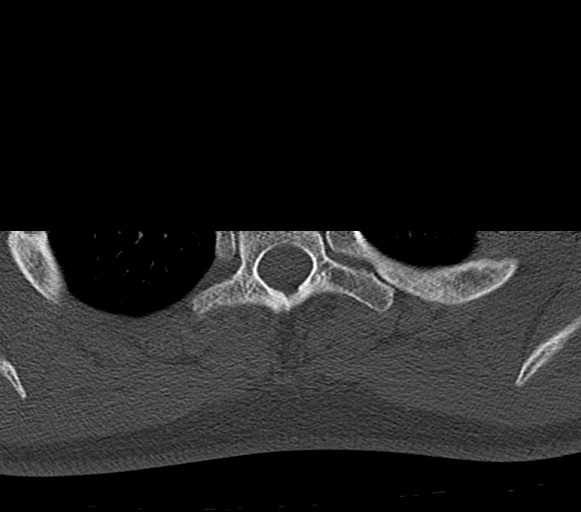
[im 51/119  bone]
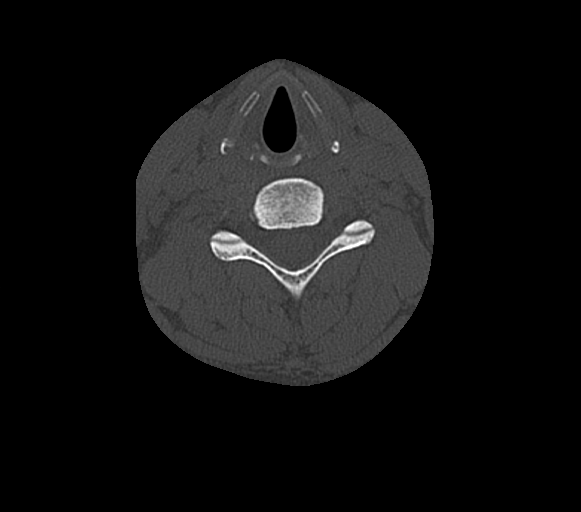
[im 68/119  bone]
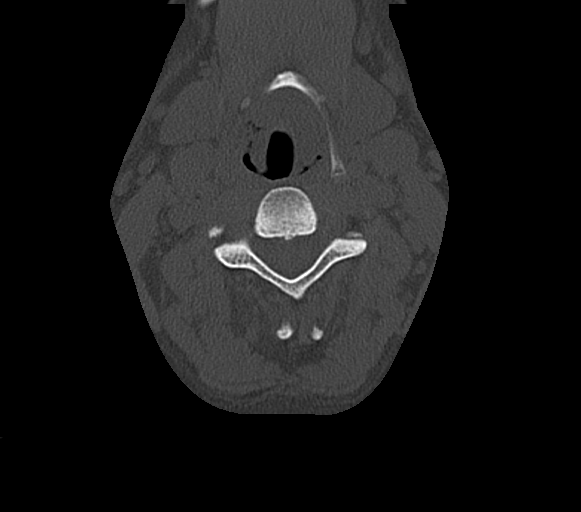
[im 102/119  bone]
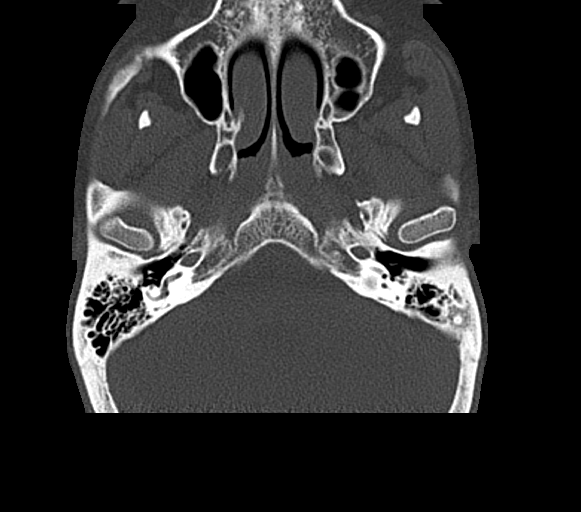

[Series 6: coronal bone · coronal · 0.38mm/px · 3 of 86 slices shown]
[im 25/86  bone]
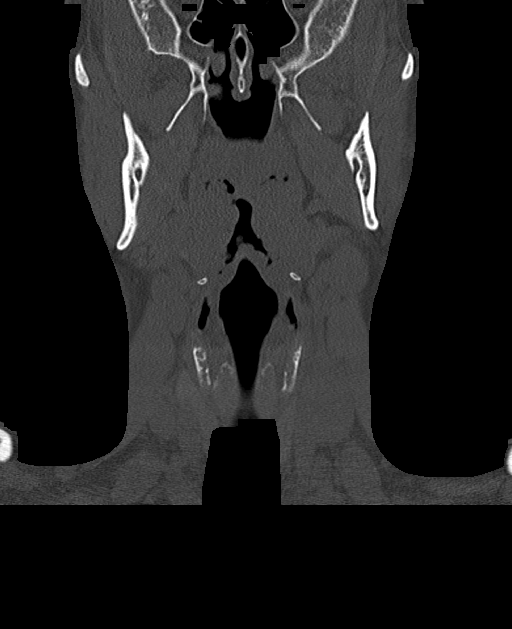
[im 37/86  bone]
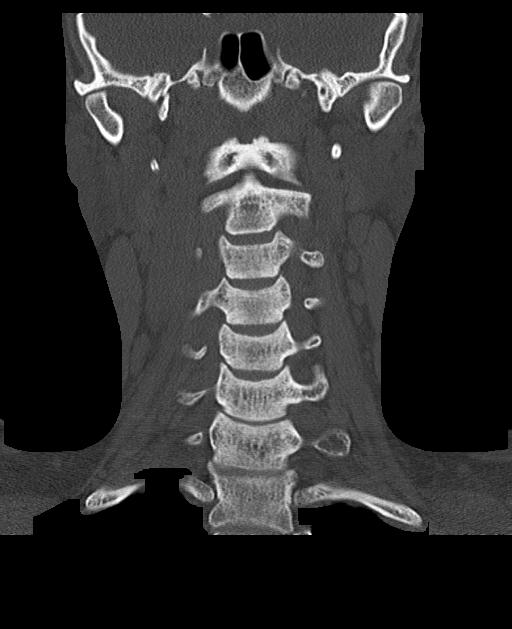
[im 49/86  bone]
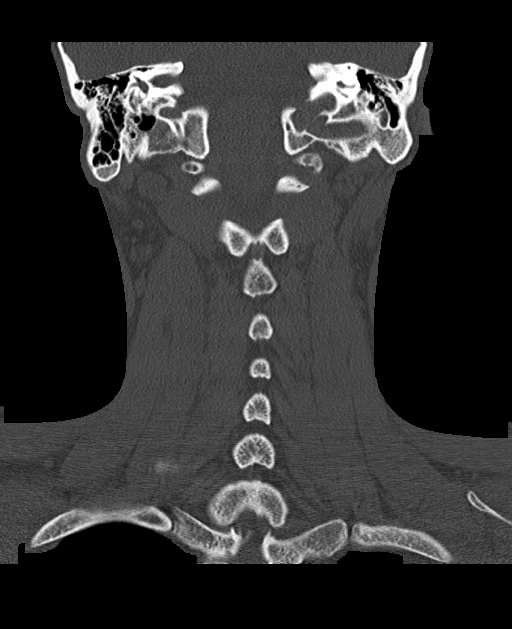

[Series 7: sagittal bone · sagittal · 0.33mm/px · 5 of 97 slices shown, 6 images]
[im 33/97  bone]
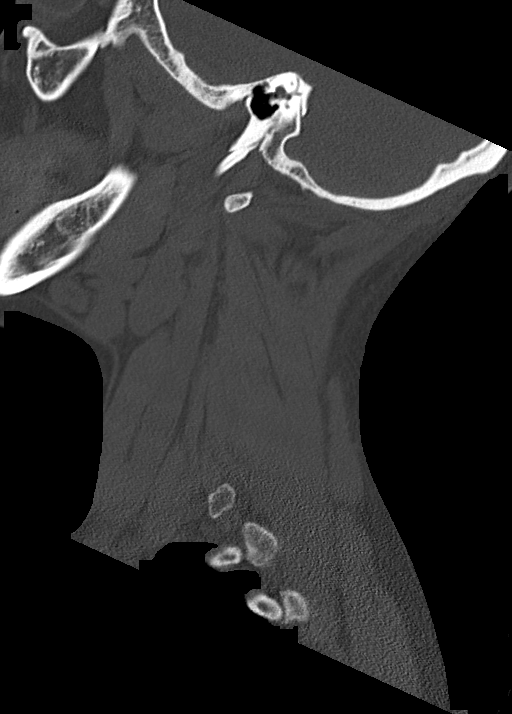
[im 41/97  bone]
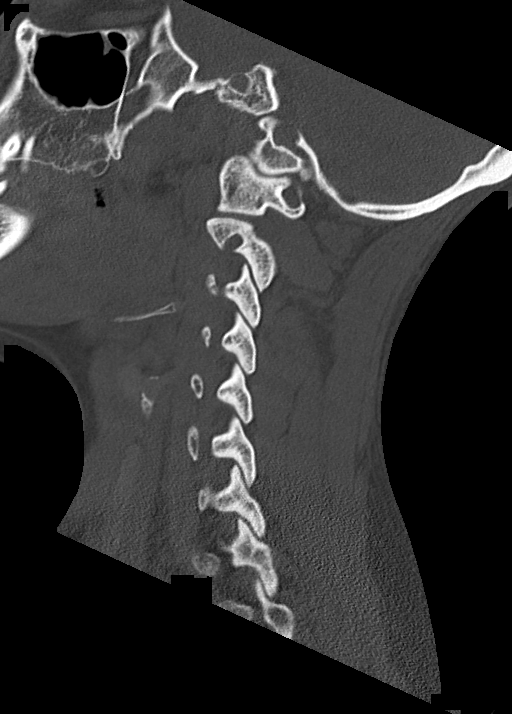
[im 49/97  soft-tissue]
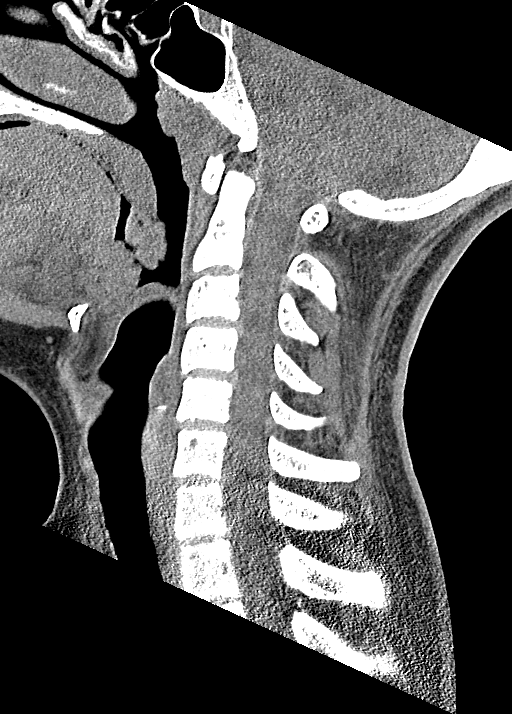
[im 49/97  bone]
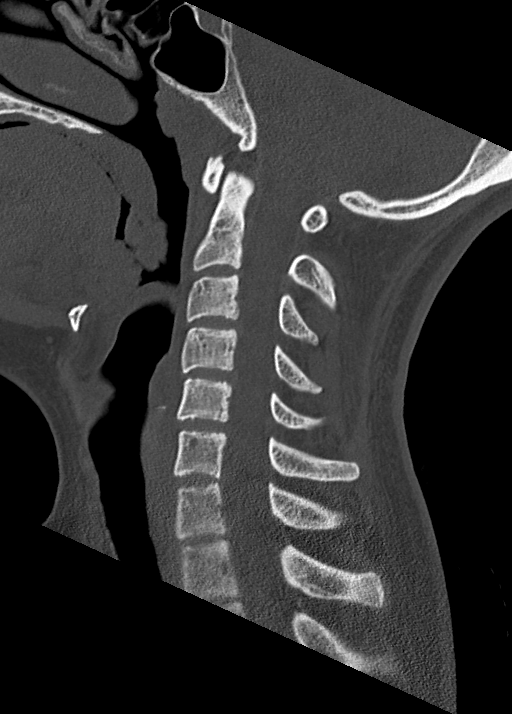
[im 57/97  bone]
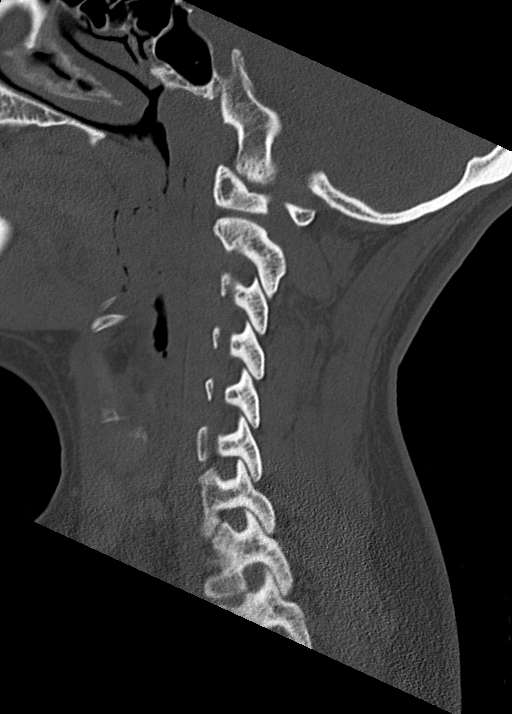
[im 65/97  bone]
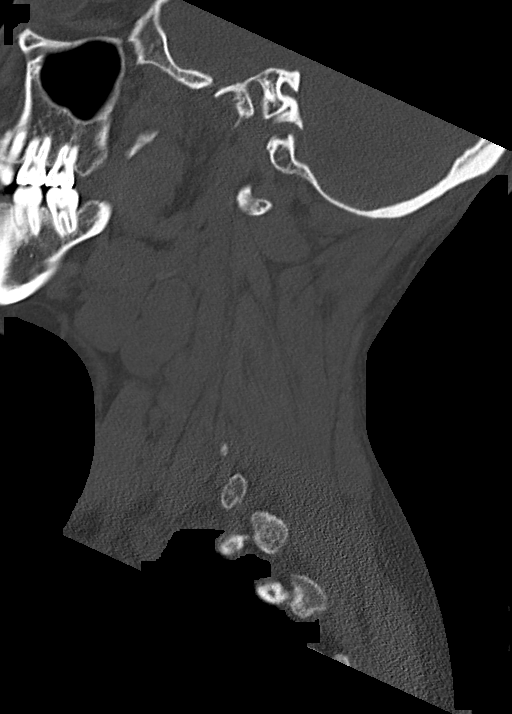

[12 of 33 positions shown; findings below may reference images not displayed]

FINDINGS: CT HEAD FINDINGS

Brain: There is no evidence of an acute infarct, intracranial
hemorrhage, mass, midline shift, or extra-axial fluid collection.
The ventricles and sulci are normal.

Vascular: No hyperdense vessel.

Skull: No fracture or suspicious osseous lesion.

Sinuses/Orbits: Mild left frontal and left ethmoid sinus mucosal
thickening. Clear mastoid air cells. Unremarkable included orbits.

Other: None.

CT CERVICAL SPINE FINDINGS

Alignment: Cervical spine straightening.  No listhesis.

Skull base and vertebrae: No acute fracture or suspicious osseous
lesion.

Soft tissues and spinal canal: No prevertebral fluid or swelling. No
visible canal hematoma.

Disc levels:  Unremarkable.

Upper chest: Clear lung apices.

Other: Borderline enlarged level II lymph nodes, nonspecific but
most likely reactive bilateral tonsillar hypertrophy.
IMPRESSION: 1. No evidence of acute intracranial abnormality.
2. No evidence of acute fracture or subluxation in the cervical
spine.

## 2022-09-19 IMAGING — CT CT HEAD W/O CM
3 series · 14 of 47 positions shown, 16 images · non-contrast
Comparison: None.

CLINICAL DATA: Head and neck trauma.  Altered mental status.



[Series 3: head wo · axial · 0.47mm/px · z∈[-87,+43]mm · 8 of 32 slices shown, 10 images]
[im 3/32  brain]
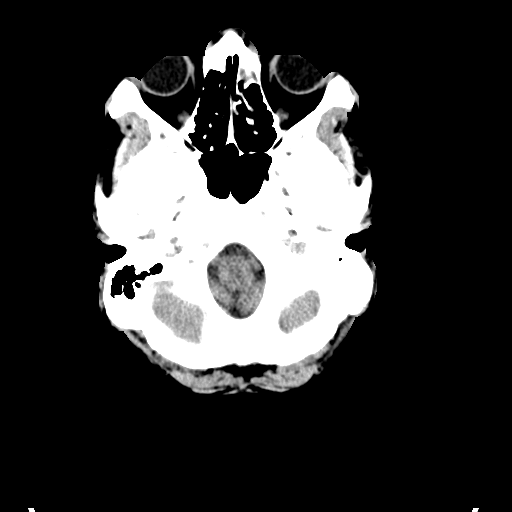
[im 3/32  bone]
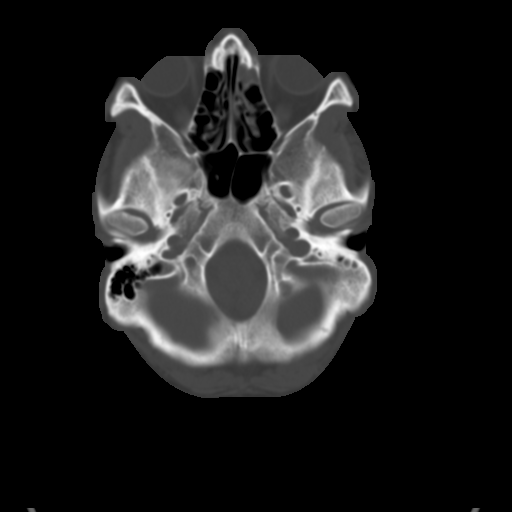
[im 7/32  brain]
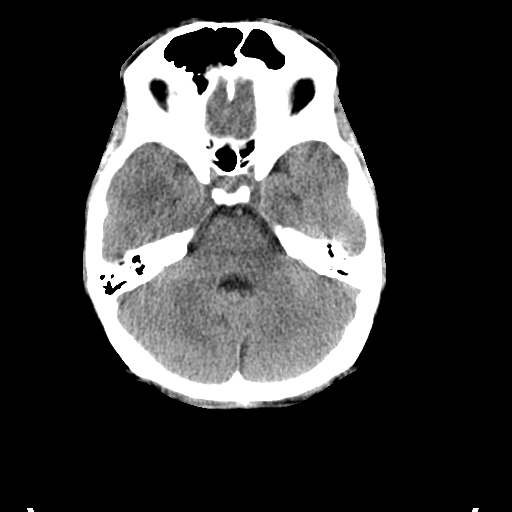
[im 10/32  brain]
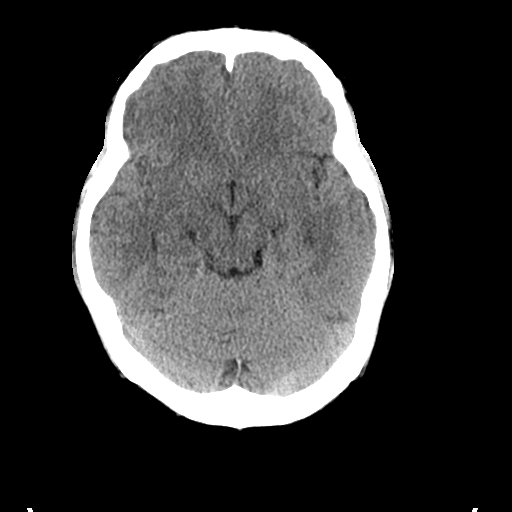
[im 14/32  brain]
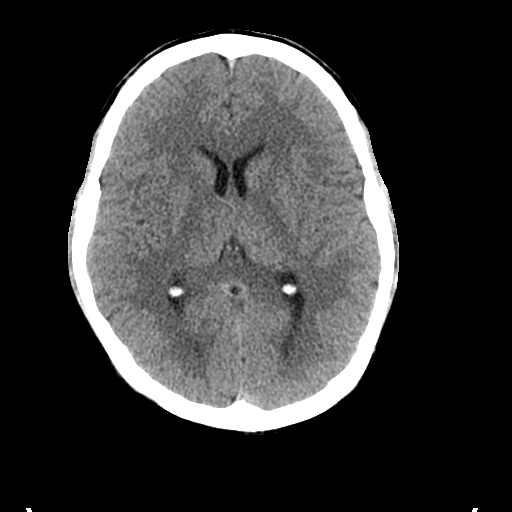
[im 18/32  brain]
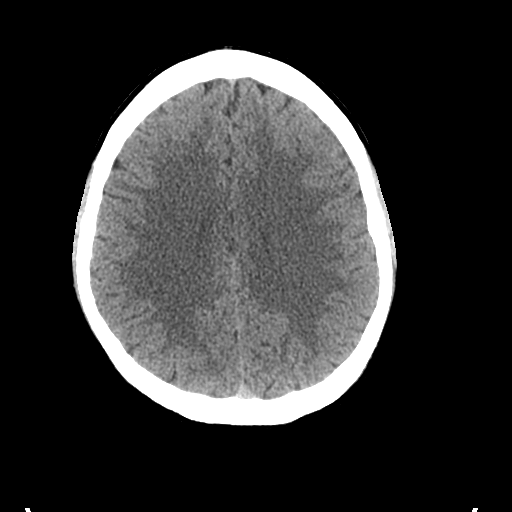
[im 18/32  bone]
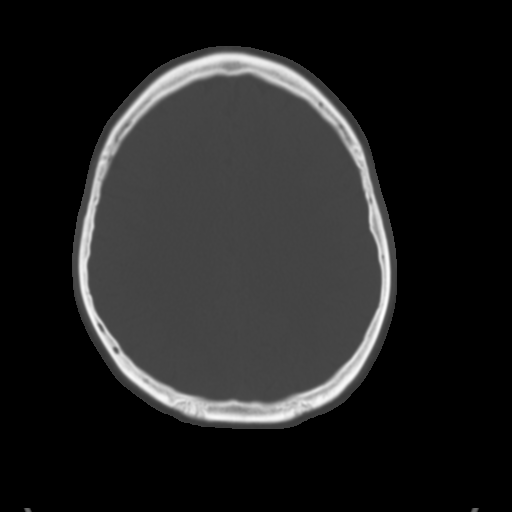
[im 22/32  brain]
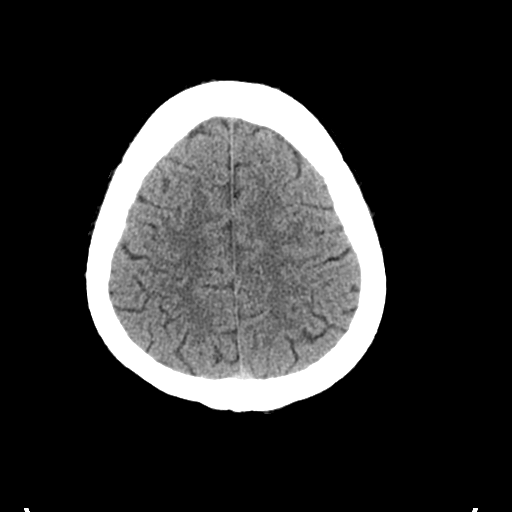
[im 25/32  brain]
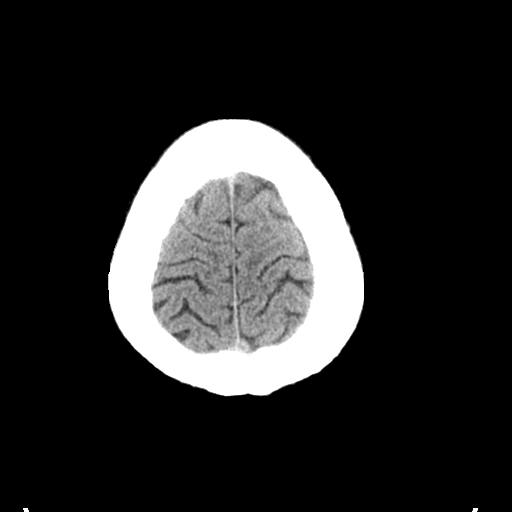
[im 29/32  brain]
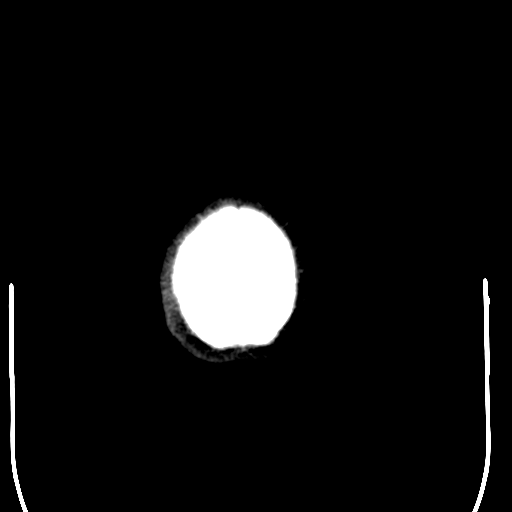

[Series 5: coronal soft tissue · coronal · 0.33mm/px · 3 of 83 slices shown]
[im 28/83  brain]
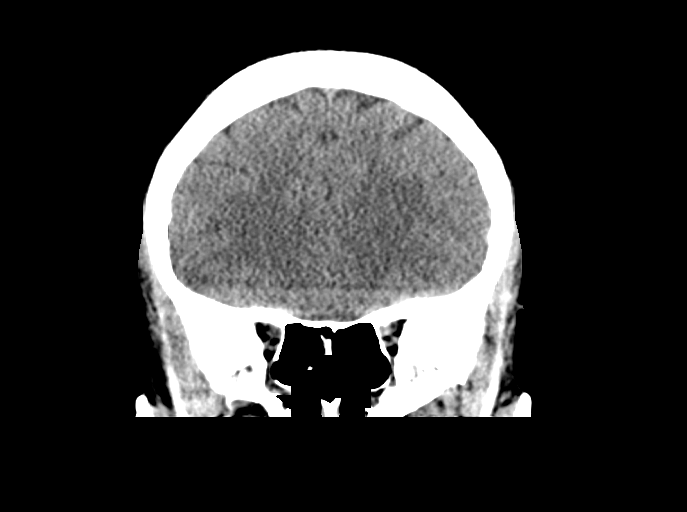
[im 37/83  brain]
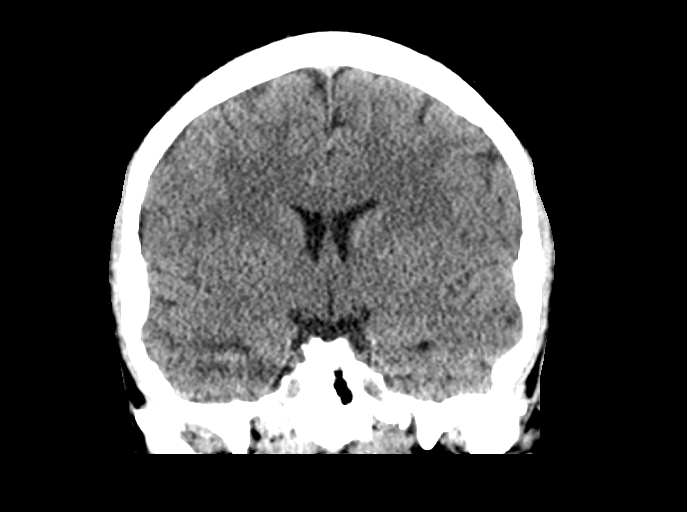
[im 46/83  brain]
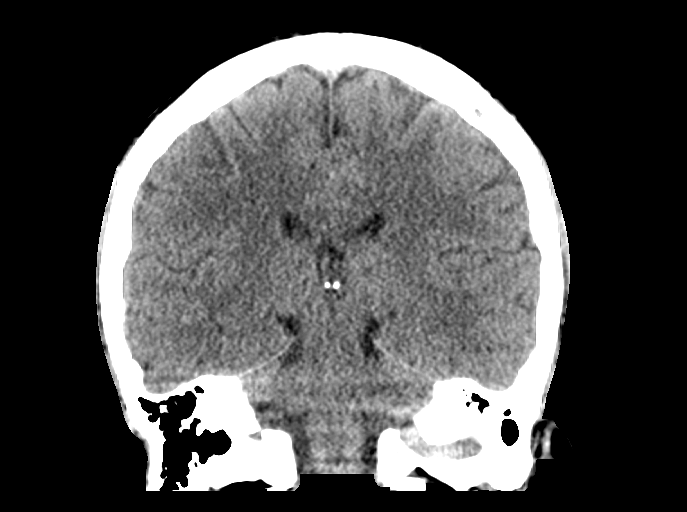

[Series 6: sagittal soft tissue · sagittal · 0.33mm/px · 3 of 75 slices shown]
[im 25/75  brain]
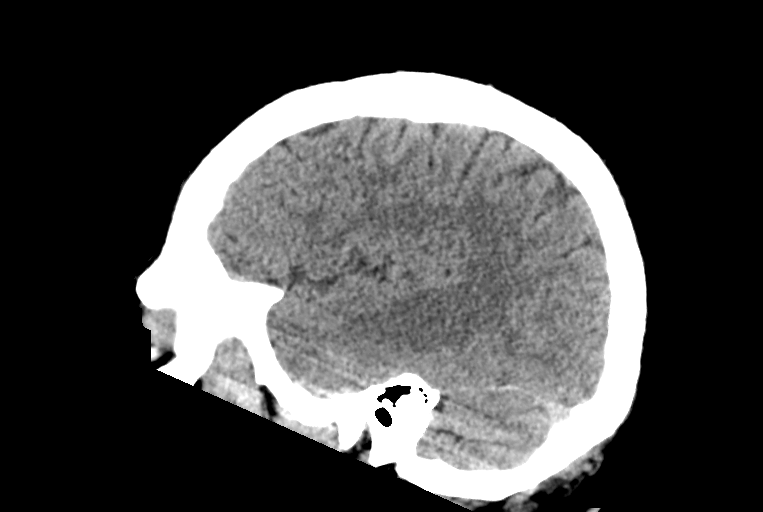
[im 38/75  brain]
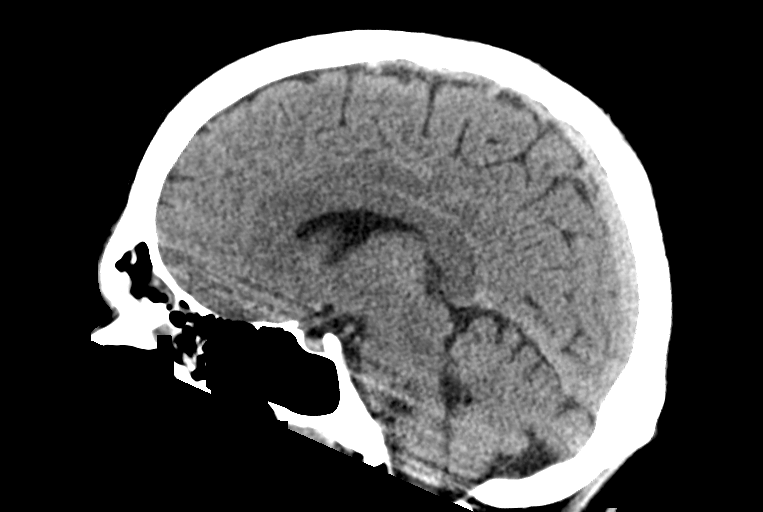
[im 50/75  brain]
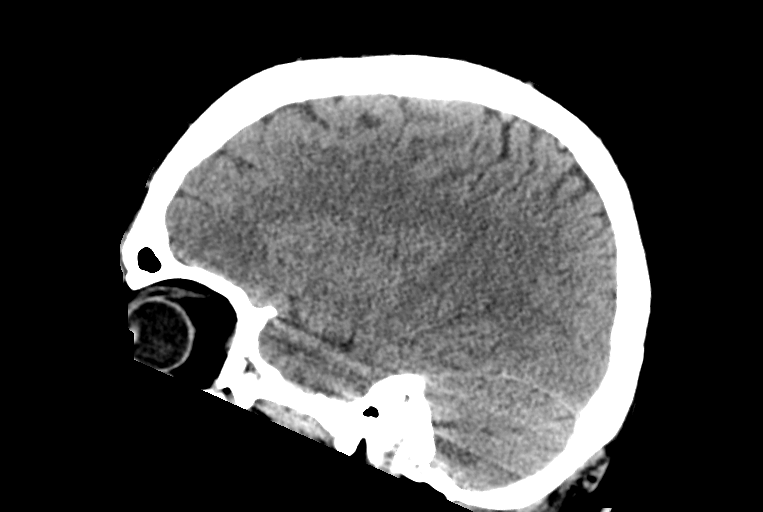

[14 of 47 positions shown; findings below may reference images not displayed]

FINDINGS: CT HEAD FINDINGS

Brain: There is no evidence of an acute infarct, intracranial
hemorrhage, mass, midline shift, or extra-axial fluid collection.
The ventricles and sulci are normal.

Vascular: No hyperdense vessel.

Skull: No fracture or suspicious osseous lesion.

Sinuses/Orbits: Mild left frontal and left ethmoid sinus mucosal
thickening. Clear mastoid air cells. Unremarkable included orbits.

Other: None.

CT CERVICAL SPINE FINDINGS

Alignment: Cervical spine straightening.  No listhesis.

Skull base and vertebrae: No acute fracture or suspicious osseous
lesion.

Soft tissues and spinal canal: No prevertebral fluid or swelling. No
visible canal hematoma.

Disc levels:  Unremarkable.

Upper chest: Clear lung apices.

Other: Borderline enlarged level II lymph nodes, nonspecific but
most likely reactive bilateral tonsillar hypertrophy.
IMPRESSION: 1. No evidence of acute intracranial abnormality.
2. No evidence of acute fracture or subluxation in the cervical
spine.

## 2022-09-29 NOTE — Progress Notes (Deleted)
Cardiology Office Note:   Date:  09/29/2022  NAME:  Bernard Foster    MRN: [id number] DOB:  Nov 29, 1995   PCP:  Patient, No Pcp Per  Cardiologist:  None  Electrophysiologist:  None   Referring MD: Roylene Reason, PA*   No chief complaint on file. ***  History of Present Illness:   Bernard Foster is a 27 y.o. male with a hx of cholecystectomy who is being seen today for the evaluation of syncope at the request of Roylene Reason, [place]*.  Past Medical History: No past medical history on file.  Past Surgical History: Past Surgical History:  Procedure Laterality Date   CHOLECYSTECTOMY      Current Medications: No outpatient medications have been marked as taking for the 10/03/22 encounter (Appointment) with O'Neal, Cassie Freer, MD.     Allergies:    Patient has no known allergies.   Social History: Social History   Socioeconomic History   Marital status: Married    Spouse name: Not on file   Number of children: Not on file   Years of education: Not on file   Highest education level: Not on file  Occupational History   Not on file  Tobacco Use   Smoking status: Never   Smokeless tobacco: Never  Substance and Sexual Activity   Alcohol use: Yes   Drug use: Yes    Types: Marijuana   Sexual activity: Not on file  Other Topics Concern   Not on file  Social History Narrative   Not on file   Social Determinants of Health   Financial Resource Strain: Not on file  Food Insecurity: Not on file  Transportation Needs: Not on file  Physical Activity: Not on file  Stress: Not on file  Social Connections: Not on file     Family History: The patient's ***family history includes Cancer in his paternal grandmother; Diabetes in his paternal grandfather; Heart attack in his maternal grandfather.  ROS:   All other ROS reviewed and negative. Pertinent positives noted in the HPI.     EKGs/Labs/Other Studies Reviewed:   The following studies  were personally reviewed by me today:  EKG:  EKG is *** ordered today.  The ekg ordered today demonstrates ***, and was personally reviewed by me.   Recent Labs: 09/14/2022: BUN 9; Creatinine, Ser 0.83; Hemoglobin 14.2; Platelets 332; Potassium 3.4; Sodium 138   Recent Lipid Panel No results found for: "CHOL", "TRIG", "HDL", "CHOLHDL", "VLDL", "LDLCALC", "LDLDIRECT"  Physical Exam:   VS:  There were no vitals taken for this visit.   Wt Readings from Last 3 Encounters:  09/14/22 200 lb 9.9 oz (91 kg)  01/27/14 200 lb 12.8 oz (91.1 kg) (95 %, Z= 1.69)*   * Growth percentiles are based on CDC (Boys, 2-20 Years) data.    General: Well nourished, well developed, in no acute distress Head: Atraumatic, normal size  Eyes: PEERLA, EOMI  Neck: Supple, no JVD Endocrine: No thryomegaly Cardiac: Normal S1, S2; RRR; no murmurs, rubs, or gallops Lungs: Clear to auscultation bilaterally, no wheezing, rhonchi or rales  Abd: Soft, nontender, no hepatomegaly  Ext: No edema, pulses 2+ Musculoskeletal: No deformities, BUE and BLE strength normal and equal Skin: Warm and dry, no rashes   Neuro: Alert and oriented to person, place, time, and situation, CNII-XII grossly intact, no focal deficits  Psych: Normal mood and affect   ASSESSMENT:   Bernard Foster is a 27 y.o. male who  presents for the following: No diagnosis found.  PLAN:   There are no diagnoses linked to this encounter.  {Are you ordering a CV Procedure (e.g. stress test, cath, DCCV, TEE, etc)?   Press F2        :[id number]  Disposition: No follow-ups on file.  Medication Adjustments/Labs and Tests Ordered: Current medicines are reviewed at length with the patient today.  Concerns regarding medicines are outlined above.  No orders of the defined types were placed in this encounter.  No orders of the defined types were placed in this encounter.   There are no Patient Instructions on file for this visit.   Time Spent  with Patient: I have spent a total of *** minutes with patient reviewing hospital notes, telemetry, EKGs, labs and examining the patient as well as establishing an assessment and plan that was discussed with the patient.  > 50% of time was spent in direct patient care.  Signed, Addison Naegeli. Audie Box, MD, [place]  [address] [place], [place] [address] [phone]  09/29/2022 9:08 PM

## 2022-10-03 ENCOUNTER — Ambulatory Visit: Payer: Self-pay | Attending: Cardiovascular Disease | Admitting: Cardiovascular Disease

## 2022-10-03 ENCOUNTER — Telehealth: Payer: Self-pay | Admitting: Licensed Clinical Social Worker

## 2022-10-03 DIAGNOSIS — R55 Syncope and collapse: Secondary | ICD-10-CM

## 2022-10-03 NOTE — Telephone Encounter (Signed)
H&V Care Navigation CSW Progress Note  Clinical Social Worker  mailed pt a copy of PCP community clinics, [facility] and [employer] [facility]  as pt is currently uninsured with no PCP. Pt unfortunately no showed appt this morning. If pt rescheduled I can provide further assistance to complete assistance applications as needed.   Patient is participating in a Managed Medicaid Plan:  No, self pay only.   SDOH Screenings   Tobacco Use: Low Risk  (10/30/2021)    Westley Hummer, MSW, [place]  [phone]- work cell phone (preferred) [phone]- desk phone
